# Patient Record
Sex: Male | Born: 2000 | Race: White | Hispanic: No | Marital: Single | Smoking: Never smoker
Health system: Southern US, Community
[De-identification: ages and names within clinical notes are randomized; demographics above are authoritative.]

## PROBLEM LIST (undated history)

## (undated) DIAGNOSIS — Z8489 Family history of other specified conditions: Secondary | ICD-10-CM

## (undated) DIAGNOSIS — T7840XA Allergy, unspecified, initial encounter: Secondary | ICD-10-CM

## (undated) HISTORY — PX: NO PAST SURGERIES: SHX2092

---
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
## 2022-07-19 ENCOUNTER — Ambulatory Visit
Admission: EM | Admit: 2022-07-19 | Discharge: 2022-07-19 | Disposition: A | Discharge status: Home / Self Care | Payer: Commercial Managed Care - PPO | Attending: Emergency Medicine | Admitting: Emergency Medicine

## 2022-07-19 ENCOUNTER — Encounter: Payer: Self-pay | Admitting: Emergency Medicine

## 2022-07-19 DIAGNOSIS — J029 Acute pharyngitis, unspecified: Secondary | ICD-10-CM

## 2022-07-19 DIAGNOSIS — H6693 Otitis media, unspecified, bilateral: Secondary | ICD-10-CM

## 2022-07-19 LAB — POCT RAPID STREP A (OFFICE): Rapid Strep A Screen: NEGATIVE

## 2022-07-19 LAB — POCT MONO SCREEN (KUC): Mono, POC: NEGATIVE

## 2022-07-19 MED ORDER — AMOXICILLIN 875 MG PO TABS: 875.0000 mg | ORAL_TABLET | Freq: Two times a day (BID) | ORAL | 0 refills | Status: AC

## 2022-07-19 NOTE — ED Triage Notes (Signed)
Pt presents with ST, fever and fatigue since yesterday.

## 2022-07-19 NOTE — ED Provider Notes (Signed)
Connor Liu    CSN: [id number] Arrival date & time: 07/19/22  1256      History   Chief Complaint Chief Complaint  Patient presents with   Sore Throat   Fatigue   Fever    HPI Connor Liu is a 21 y.o. male.  Accompanied by his mother, patient presents with fever, chills, fatigue, sore throat, ear pain x1 day.  He reports similar symptoms last year when he had mono.  He denies rash, cough, shortness of breath, vomiting, diarrhea, or other symptoms.  Treatment at home with Tylenol.  The history is provided by the patient and a parent.    Past Medical History:  Diagnosis Date   Allergy    Family history of adverse reaction to anesthesia    mom and sister-n/v    There are no problems to display for this patient.   Past Surgical History:  Procedure Laterality Date   NO PAST SURGERIES         Home Medications    Prior to Admission medications   Medication Sig Start Date End Date Taking? Authorizing Provider  amoxicillin (AMOXIL) 875 MG tablet Take 1 tablet (875 mg total) by mouth 2 (two) times daily for 10 days. 07/19/22 07/29/22 Yes Mickie Bail, NP  ibuprofen (ADVIL) 200 MG tablet Take 600 mg by mouth every 6 (six) hours as needed for moderate pain.    [provider]  Multiple Vitamin (MULTIVITAMIN WITH MINERALS) TABS tablet Take 1 tablet by mouth daily.    [provider]    Family History History reviewed. No pertinent family history.  Social History Social History   Tobacco Use   Smoking status: Never   Smokeless tobacco: Never  Vaping Use   Vaping Use: Never used  Substance Use Topics   Alcohol use: Never   Drug use: Never     Allergies   Patient has no known allergies.   Review of Systems Review of Systems  Constitutional:  Positive for chills, fatigue and fever.  HENT:  Positive for ear pain and sore throat.   Respiratory:  Negative for cough and shortness of breath.   Gastrointestinal:  Negative for diarrhea  and vomiting.  Skin:  Negative for color change and rash.  All other systems reviewed and are negative.    Physical Exam Triage Vital Signs ED Triage Vitals [07/19/22 1314]  Enc Vitals Group     BP 131/72     Pulse Rate 79     Resp 18     Temp 99.5 F (37.5 C)     Temp Source Oral     SpO2 96 %     Weight      Height      Head Circumference      Peak Flow      Pain Score 7     Pain Loc      Pain Edu?      Excl. in GC?    No data found.  Updated Vital Signs BP 131/72 (BP Location: Right Arm)   Pulse 79   Temp 99.5 F (37.5 C) (Oral)   Resp 18   SpO2 96%   Visual Acuity Right Eye Distance:   Left Eye Distance:   Bilateral Distance:    Right Eye Near:   Left Eye Near:    Bilateral Near:     Physical Exam Vitals and nursing note reviewed.  Constitutional:      General: He is not in  acute distress.    Appearance: Normal appearance. He is well-developed. He is not ill-appearing.  HENT:     Right Ear: Tympanic membrane is erythematous.     Left Ear: Tympanic membrane is erythematous.     Nose: Nose normal.     Mouth/Throat:     Mouth: Mucous membranes are moist.     Pharynx: Oropharyngeal exudate and posterior oropharyngeal erythema present.  Cardiovascular:     Rate and Rhythm: Normal rate and regular rhythm.     Heart sounds: Normal heart sounds.  Pulmonary:     Effort: Pulmonary effort is normal. No respiratory distress.     Breath sounds: Normal breath sounds.  Musculoskeletal:     Cervical back: Neck supple.  Skin:    General: Skin is warm and dry.     Findings: No rash.  Neurological:     Mental Status: He is alert.  Psychiatric:        Mood and Affect: Mood normal.        Behavior: Behavior normal.      UC Treatments / Results  Labs (all labs ordered are listed, but only abnormal results are displayed) Labs Reviewed  POCT RAPID STREP A (OFFICE)  POCT MONO SCREEN ([facility])    EKG   Radiology No results  found.  Procedures Procedures (including critical care time)  Medications Ordered in UC Medications - No data to display  Initial Impression / Assessment and Plan / UC Course  I have reviewed the triage vital signs and the nursing notes.  Pertinent labs & imaging results that were available during my care of the patient were reviewed by me and considered in my medical decision making (see chart for details).   Sore throat, bilateral otitis media.  Rapid strep and mono negative.  Based on exam today, treating with amoxicillin.  Discussed symptomatic treatment also including Tylenol or ibuprofen rest, hydration.  Instructed patient to follow-up with his PCP if his symptoms are not improving.  He agrees to plan of care.   Final Clinical Impressions(s) / UC Diagnoses   Final diagnoses:  Sore throat  Bilateral otitis media, unspecified otitis media type     Discharge Instructions      Strep and mono are negative.    Take the amoxicillin as directed for ear infection and sore throat.  Follow up with your primary care provider if your symptoms are not improving.        ED Prescriptions     Medication Sig Dispense Auth. Provider   amoxicillin (AMOXIL) 875 MG tablet Take 1 tablet (875 mg total) by mouth 2 (two) times daily for 10 days. 20 tablet Mickie Bail, NP      PDMP not reviewed this encounter.   Mickie Bail, NP 07/19/22 1415

## 2022-07-19 NOTE — Discharge Instructions (Addendum)
Strep and mono are negative.    Take the amoxicillin as directed for ear infection and sore throat.  Follow up with your primary care provider if your symptoms are not improving.
